# Patient Record
Sex: Female | Born: 1995 | Race: Black or African American | Hispanic: No | Marital: Single | Smoking: Never smoker
Health system: Southern US, Community
[De-identification: ages and names within clinical notes are randomized; demographics above are authoritative.]

## PROBLEM LIST (undated history)

## (undated) DIAGNOSIS — G43909 Migraine, unspecified, not intractable, without status migrainosus: Secondary | ICD-10-CM

---

## 2017-09-25 ENCOUNTER — Emergency Department (HOSPITAL_COMMUNITY)
Admission: EM | Admit: 2017-09-25 | Discharge: 2017-09-25 | Disposition: A | Discharge status: Home / Self Care | Payer: BLUE CROSS/BLUE SHIELD | Attending: Emergency Medicine | Admitting: Emergency Medicine

## 2017-09-25 ENCOUNTER — Encounter (HOSPITAL_COMMUNITY): Payer: Self-pay | Admitting: *Deleted

## 2017-09-25 ENCOUNTER — Emergency Department (HOSPITAL_COMMUNITY): Payer: BLUE CROSS/BLUE SHIELD

## 2017-09-25 ENCOUNTER — Other Ambulatory Visit: Payer: Self-pay

## 2017-09-25 DIAGNOSIS — F1210 Cannabis abuse, uncomplicated: Secondary | ICD-10-CM | POA: Diagnosis not present

## 2017-09-25 DIAGNOSIS — R51 Headache: Secondary | ICD-10-CM | POA: Diagnosis present

## 2017-09-25 DIAGNOSIS — R519 Headache, unspecified: Secondary | ICD-10-CM

## 2017-09-25 HISTORY — DX: Migraine, unspecified, not intractable, without status migrainosus: G43.909

## 2017-09-25 MED ORDER — SODIUM CHLORIDE 0.9 % IV BOLUS (SEPSIS)
1000.0000 mL | Freq: Once | INTRAVENOUS | Status: AC
  Administered 2017-09-25: 1000 mL via INTRAVENOUS

## 2017-09-25 MED ORDER — BUTALBITAL-APAP-CAFFEINE 50-325-40 MG PO TABS: 1.0000 | ORAL_TABLET | Freq: Four times a day (QID) | ORAL | 0 refills | Status: AC | PRN

## 2017-09-25 MED ORDER — DIPHENHYDRAMINE HCL 50 MG/ML IJ SOLN
25.0000 mg | Freq: Once | INTRAMUSCULAR | Status: AC
  Administered 2017-09-25: 25 mg via INTRAVENOUS
  Filled 2017-09-25: qty 1

## 2017-09-25 MED ORDER — KETOROLAC TROMETHAMINE 30 MG/ML IJ SOLN
30.0000 mg | Freq: Once | INTRAMUSCULAR | Status: AC
  Administered 2017-09-25: 30 mg via INTRAVENOUS
  Filled 2017-09-25: qty 1

## 2017-09-25 MED ORDER — DEXAMETHASONE SODIUM PHOSPHATE 10 MG/ML IJ SOLN
10.0000 mg | Freq: Once | INTRAMUSCULAR | Status: AC
  Administered 2017-09-25: 10 mg via INTRAVENOUS
  Filled 2017-09-25: qty 1

## 2017-09-25 MED ORDER — METOCLOPRAMIDE HCL 5 MG/ML IJ SOLN
10.0000 mg | Freq: Once | INTRAMUSCULAR | Status: AC
  Administered 2017-09-25: 10 mg via INTRAVENOUS
  Filled 2017-09-25: qty 2

## 2017-09-25 NOTE — ED Notes (Signed)
ED Provider at bedside. 

## 2017-09-25 NOTE — ED Notes (Signed)
Patient transported to CT 

## 2017-09-25 NOTE — ED Provider Notes (Signed)
[facility]   CSN: [id number] Arrival date & time: 09/25/17  0848     History   Chief Complaint Chief Complaint  Patient presents with  . Headache    HPI Shari Johnson is a 21 y.o. female.  HPI   21 year old female with history of migraine presenting for evaluation of headache.  Patient developed gradual onset of progressive worsening headache that started earlier this morning.  Headache is described as a sharp and soreness sensation to the right side of her face radiates down to right neck with tingling sensation to her left arm.  She rates the pain as 10 out of 10.  She endorsed light and sound sensitivity, feeling nauseous, and has vomited several times of nonbloody nonbilious content.  Headache feels similar to prior migraine but more severe.  No specific treatment tried.  She denies being pregnant due to same sex practice.  No report of fever, chills, URI symptoms, or rash.  Past Medical History:  Diagnosis Date  . Migraine     There are no active problems to display for this patient.   History reviewed. No pertinent surgical history.  OB History    No data available       Home Medications    Prior to Admission medications   Not on File    Family History History reviewed. No pertinent family history.  Social History Social History   Tobacco Use  . Smoking status: Never Smoker  Substance Use Topics  . Alcohol use: No    Frequency: Never  . Drug use: Yes    Types: Marijuana     Allergies   Amoxicillin   Review of Systems Review of Systems  All other systems reviewed and are negative.    Physical Exam Updated Vital Signs BP 115/75 (BP Location: Right Arm)   Pulse 81   Temp 98.2 F (36.8 C) (Oral)   Resp 16   Ht 5\' 4"  (1.626 m)   Wt 70.3 kg (155 lb)   LMP 08/27/2017   SpO2 99%   BMI 26.61 kg/m   Physical Exam  Constitutional: She is oriented to person, place, and  time. She appears well-developed and well-nourished. No distress.  Appears uncomfortable but nontoxic  HENT:  Head: Atraumatic.  Eyes: Conjunctivae and EOM are normal. Pupils are equal, round, and reactive to light.  Neck: Normal range of motion. Neck supple.  Mild tenderness to right paracervical spinal muscle but no nuchal rigidity or cervical midline spine tenderness  Cardiovascular: Normal rate and regular rhythm.  Pulmonary/Chest: Effort normal and breath sounds normal.  Neurological: She is alert and oriented to person, place, and time. GCS eye subscore is 4. GCS verbal subscore is 5. GCS motor subscore is 6.  Neurologic exam:  Speech clear, pupils equal round reactive to light, extraocular movements intact  Normal peripheral visual fields Cranial nerves III through XII normal including no facial droop Follows commands, moves all extremities x4, normal strength to bilateral upper and lower extremities at all major muscle groups including grip Sensation normal to light touch  Coordination intact, no limb ataxia, finger-nose-finger normal Rapid alternating movements normal No pronator drift Gait normal   Skin: Skin is warm. No rash noted.  Psychiatric: She has a normal mood and affect.  Nursing note and vitals reviewed.    ED Treatments / Results  Labs (all labs ordered are listed, but only abnormal results are displayed) Labs Reviewed - No data to display  EKG  EKG Interpretation None       Radiology Ct Head Wo Contrast  Result Date: 09/25/2017 CLINICAL DATA:  Headache EXAM: CT HEAD WITHOUT CONTRAST TECHNIQUE: Contiguous axial images were obtained from the base of the skull through the vertex without intravenous contrast. COMPARISON:  None. FINDINGS: Brain: No evidence of acute infarction, hemorrhage, hydrocephalus, extra-axial collection or mass lesion/mass effect. Vascular: No hyperdense vessel or unexpected calcification. Skull: Normal. Negative for fracture or  focal lesion. Sinuses/Orbits: The visualized paranasal sinuses are essentially clear. The mastoid air cells are unopacified. Other: None. IMPRESSION: Normal head CT. Electronically Signed   By: Charline BillsSriyesh  Krishnan M.D.   On: 09/25/2017 11:24    Procedures Procedures (including critical care time)  Medications Ordered in ED Medications  ketorolac (TORADOL) 30 MG/ML injection 30 mg (30 mg Intravenous Given 09/25/17 1021)  diphenhydrAMINE (BENADRYL) injection 25 mg (25 mg Intravenous Given 09/25/17 1023)  metoCLOPramide (REGLAN) injection 10 mg (10 mg Intravenous Given 09/25/17 1022)  dexamethasone (DECADRON) injection 10 mg (10 mg Intravenous Given 09/25/17 1020)  sodium chloride 0.9 % bolus 1,000 mL (0 mLs Intravenous Stopped 09/25/17 1145)     Initial Impression / Assessment and Plan / ED Course  I have reviewed the triage vital signs and the nursing notes.  Pertinent labs & imaging results that were available during my care of the patient were reviewed by me and considered in my medical decision making (see chart for details).     BP (!) 132/94   Pulse 79   Temp 98.2 F (36.8 C) (Oral)   Resp 12   Ht 5\' 4"  (1.626 m)   Wt 70.3 kg (155 lb)   LMP 08/27/2017   SpO2 100%   BMI 26.61 kg/m    Final Clinical Impressions(s) / ED Diagnoses   Final diagnoses:  Bad headache    ED Discharge Orders        Ordered    butalbital-acetaminophen-caffeine (FIORICET, ESGIC) 50-325-40 MG tablet  Every 6 hours PRN     09/25/17 1240     Headache similar to previous, no fever, neck stiffness, neuro findings or new symptoms to suggest more serious etiology.  I don't think SAH, ICH, meningitis, encephalitis, mass at this time.  No recent trauma.  I don't feel imaging necessary at this time.  Plan to control symptoms.  12:38 PM Headache improves with migraine cocktail.  Pt felt much better. Head CT scan unremarkable.  Suspect complicated migraine causing L arm tingling.  Strength is normal.   Doubt stroke or meningitis.  Pt is stable for discharge. Return precaution given.     Fayrene Helperran, Pecolia Marando, PA-C 09/25/17 1243    Mancel BaleWentz, Elliott, MD 09/25/17 [phone]

## 2017-09-25 NOTE — ED Triage Notes (Signed)
Pt reports waking up this am with severe right side headache with left arm numbness. Has n/v and sensitivity to light. Has hx of migraines.

## 2018-08-02 ENCOUNTER — Ambulatory Visit: Payer: BLUE CROSS/BLUE SHIELD | Admitting: Neurology

## 2018-08-02 ENCOUNTER — Encounter: Payer: Self-pay | Admitting: Neurology

## 2018-08-02 VITALS — BP 122/76 | HR 64 | Ht 64.0 in | Wt 147.0 lb

## 2018-08-02 DIAGNOSIS — G43109 Migraine with aura, not intractable, without status migrainosus: Secondary | ICD-10-CM

## 2018-08-02 MED ORDER — RIZATRIPTAN BENZOATE 5 MG PO TBDP: 5.0000 mg | ORAL_TABLET | ORAL | 11 refills | Status: DC | PRN

## 2018-08-02 NOTE — Progress Notes (Signed)
PATIENT: Shari Johnson DOB: 02-Jan-1996  Chief Complaint  Patient presents with  . Migraine    Reports having migraines sporadically.  She sometimes goes months without one. She has the following symptoms with her migraines:  numbness in mouth, left arm and left hand, blurred vision, intermittent nausea.  She was recently prescribed Fioricet which works well to resolve her pain.   Marland Kitchen PCP    Gennette Pac, PA     HISTORICAL  Shari Johnson is a 22 year old female, accompanied by her parents, seen in request by her primary care PA Gennette Pac for evaluation of chronic migraine with aura, initial evaluation was on August 02, 2018. I have reviewed and summarized the referring note from the referring physician.  She experienced her first migraine headache at 11th grade, after she fell down at the basketball court, no loss of consciousness, she was able to continue play, then she developed blurry vision, massive headaches, with associated light noise sensitivity, nauseous, she was treated at local emergency room, symptoms improved with medications.  She only has occasional headache, she had another major lateralized severe pounding headache a year later, since then, she has intermittent headaches about once or twice each year, recently she reported migraine preceded by visual change in her left visual field, then numbness of her left face, both corner, left upper extremity, but sparing left lower extremity, no weakness, lasting for about 10 to 15 minutes, occasionally only has aura without headaches, but oftentimes followed by right retro-orbital area headaches, also extending to right neck, occipital region, with light noise sensitivity, nauseous, lasting for few hours,  She was given prescription of Fioricet, works very well, took away her headache in few minutes, but with her nausea, sometimes she struggled to keep her medicine down.  In between the migraine headaches, she  denies persistent lateralized motor or sensory deficit, I personally reviewed CT head without contrast in November 2018, blood was normal.  Her mother has migraine headaches, she was not sure about the trigger of her migraine headaches.  REVIEW OF SYSTEMS: Full 14 system review of systems performed and notable only for fatigue, blurred vision All other review of systems were negative.  ALLERGIES: Allergies  Allergen Reactions  . Amoxicillin Rash    Has patient had a PCN reaction causing immediate rash, facial/tongue/throat swelling, SOB or lightheadedness with hypotension: Yes Has patient had a PCN reaction causing severe rash involving mucus membranes or skin necrosis:Yes Has patient had a PCN reaction that required hospitalization: No Has patient had a PCN reaction occurring within the last 10 years: No If all of the above answers are "NO", then may proceed with Cephalosporin use.     HOME MEDICATIONS: Current Outpatient Medications  Medication Sig Dispense Refill  . butalbital-acetaminophen-caffeine (FIORICET, ESGIC) 50-325-40 MG tablet Take 1-2 tablets by mouth every 6 (six) hours as needed for headache. 20 tablet 0  . naproxen sodium (ALEVE) 220 MG tablet Take 220 mg by mouth 2 (two) times daily as needed (menstrual cramps).     No current facility-administered medications for this visit.     PAST MEDICAL HISTORY: Past Medical History:  Diagnosis Date  . Migraine     PAST SURGICAL HISTORY: History reviewed. No pertinent surgical history.  FAMILY HISTORY: Family History  Problem Relation Age of Onset  . Hypertension Mother   . Healthy Father   . Migraines Maternal Grandmother   . Hypertension Maternal Grandmother   . Heart attack Maternal Grandmother  SOCIAL HISTORY: Social History   Socioeconomic History  . Marital status: Single    Spouse name: Not on file  . Number of children: 0  . Years of education: college student  . Highest education level: Not on  file  Occupational History  . Occupation: pick off line  Social Needs  . Financial resource strain: Not on file  . Food insecurity:    Worry: Not on file    Inability: Not on file  . Transportation needs:    Medical: Not on file    Non-medical: Not on file  Tobacco Use  . Smoking status: Never Smoker  . Smokeless tobacco: Current User  Substance and Sexual Activity  . Alcohol use: No    Frequency: Never  . Drug use: Yes    Types: Marijuana    Comment: occasional use  . Sexual activity: Not on file  Lifestyle  . Physical activity:    Days per week: Not on file    Minutes per session: Not on file  . Stress: Not on file  Relationships  . Social connections:    Talks on phone: Not on file    Gets together: Not on file    Attends religious service: Not on file    Active member of club or organization: Not on file    Attends meetings of clubs or organizations: Not on file    Relationship status: Not on file  . Intimate partner violence:    Fear of current or ex partner: Not on file    Emotionally abused: Not on file    Physically abused: Not on file    Forced sexual activity: Not on file  Other Topics Concern  . Not on file  Social History Narrative   Lives at home with roommates.   Right-handed.   Occasional caffeine use.     PHYSICAL EXAM   Vitals:   08/02/18 0742  BP: 122/76  Pulse: 64  Weight: 147 lb (66.7 kg)  Height: 5\' 4"  (1.626 m)    Not recorded      Body mass index is 25.23 kg/m.  PHYSICAL EXAMNIATION:  Gen: NAD, conversant, well nourised, obese, well groomed                     Cardiovascular: Regular rate rhythm, no peripheral edema, warm, nontender. Eyes: Conjunctivae clear without exudates or hemorrhage Neck: Supple, no carotid bruits. Pulmonary: Clear to auscultation bilaterally   NEUROLOGICAL EXAM:  MENTAL STATUS: Speech:    Speech is normal; fluent and spontaneous with normal comprehension.  Cognition:     Orientation to time,  place and person     Normal recent and remote memory     Normal Attention span and concentration     Normal Language, naming, repeating,spontaneous speech     Fund of knowledge   CRANIAL NERVES: CN II: Visual fields are full to confrontation. Fundoscopic exam is normal with sharp discs and no vascular changes. Pupils are round equal and briskly reactive to light. CN III, IV, VI: extraocular movement are normal. No ptosis. CN V: Facial sensation is intact to pinprick in all 3 divisions bilaterally. Corneal responses are intact.  CN VII: Face is symmetric with normal eye closure and smile. CN VIII: Hearing is normal to rubbing fingers CN IX, X: Palate elevates symmetrically. Phonation is normal. CN XI: Head turning and shoulder shrug are intact CN XII: Tongue is midline with normal movements and no atrophy.  MOTOR: There is  no pronator drift of out-stretched arms. Muscle bulk and tone are normal. Muscle strength is normal.  REFLEXES: Reflexes are 2+ and symmetric at the biceps, triceps, knees, and ankles. Plantar responses are flexor.  SENSORY: Intact to light touch, pinprick, positional sensation and vibratory sensation are intact in fingers and toes.  COORDINATION: Rapid alternating movements and fine finger movements are intact. There is no dysmetria on finger-to-nose and heel-knee-shin.    GAIT/STANCE: Posture is normal. Gait is steady with normal steps, base, arm swing, and turning. Heel and toe walking are normal. Tandem gait is normal.  Romberg is absent.   DIAGNOSTIC DATA (LABS, IMAGING, TESTING) - I reviewed patient records, labs, notes, testing and imaging myself where available.   ASSESSMENT AND PLAN  Genevive Stasha Naraine is a 22 y.o. female   Chronic migraine with aura  CT head was normal in Nov 2018.  Maxalt as needed.  Aleve as needed  Observe, and avoid triggers,  Levert Feinstein, M.D. Ph.D.  [facility] [address] [place], [place] [address], Ph: [phone] Fax: [phone]  IO:[id number], [place], [place]

## 2018-08-02 NOTE — Patient Instructions (Signed)
You may try aleve as needed,   May mix together with Maxalt,

## 2019-05-16 IMAGING — CT CT HEAD W/O CM
4 series · 17 of 47 positions shown, 19 images · non-contrast
Comparison: None.

CLINICAL DATA: Headache

EXAM:
CT HEAD WITHOUT CONTRAST
TECHNIQUE: Contiguous axial images were obtained from the base of the skull
through the vertex without intravenous contrast.

[Series 3: head without · axial · non-contrast · 0.43mm/px · z∈[-129,-24]mm · 7 of 29 slices shown, 9 images]
[im 4/29  brain]
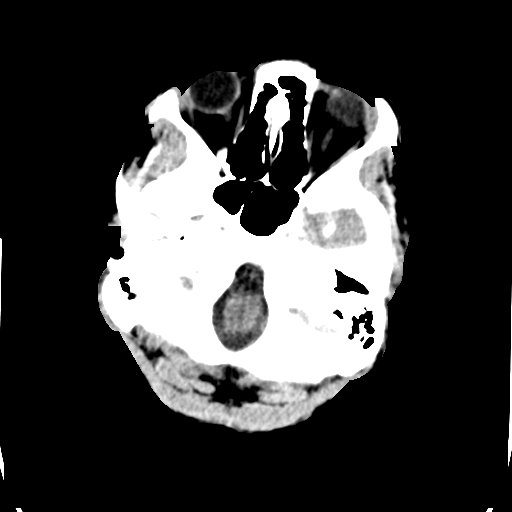
[im 4/29  bone]
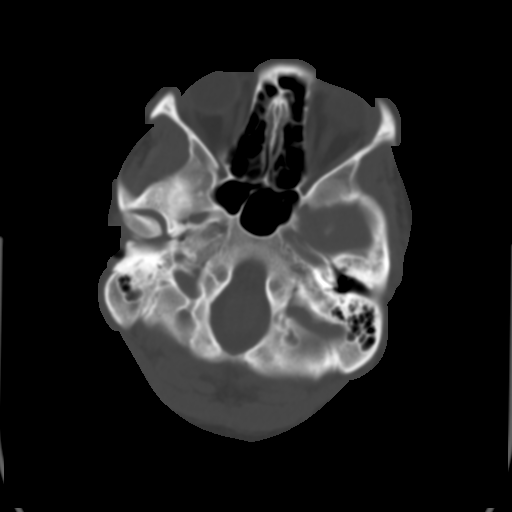
[im 8/29  brain]
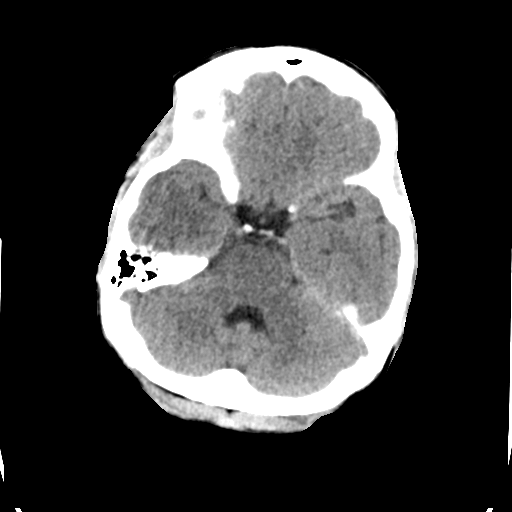
[im 11/29  brain]
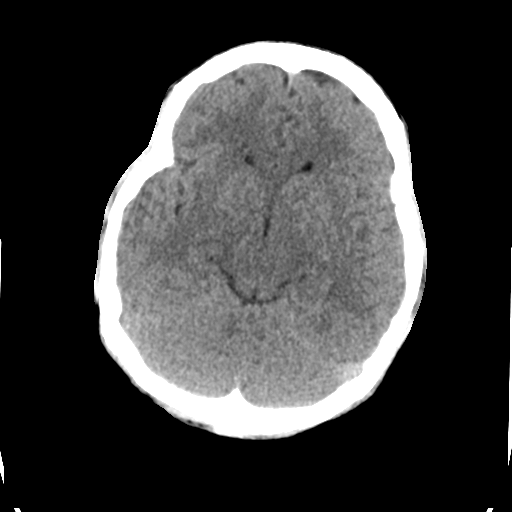
[im 15/29  brain]
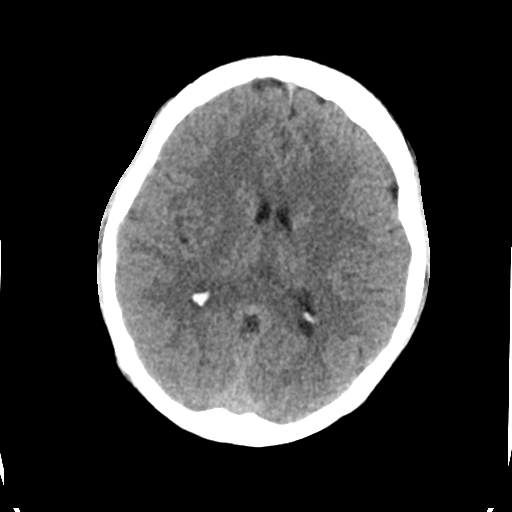
[im 18/29  brain]
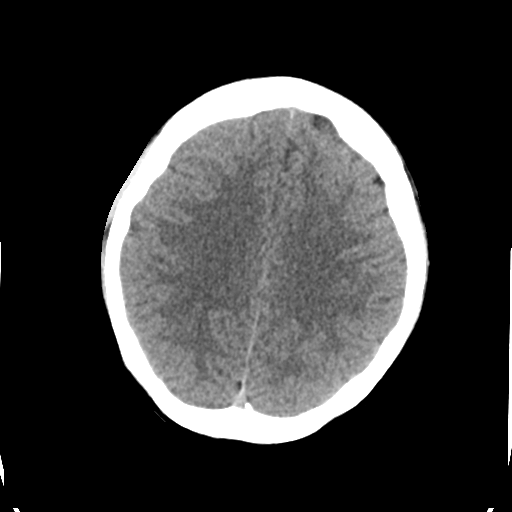
[im 18/29  bone]
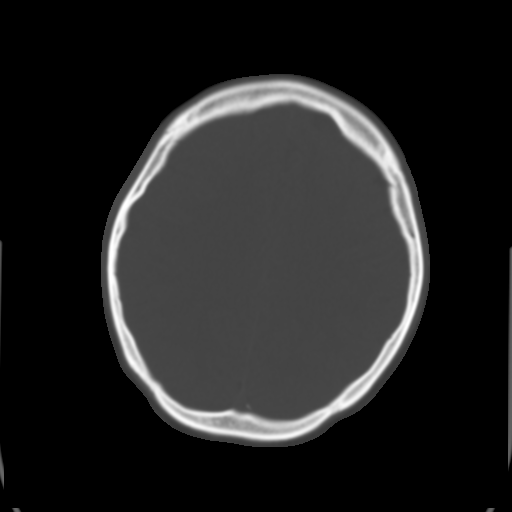
[im 22/29  brain]
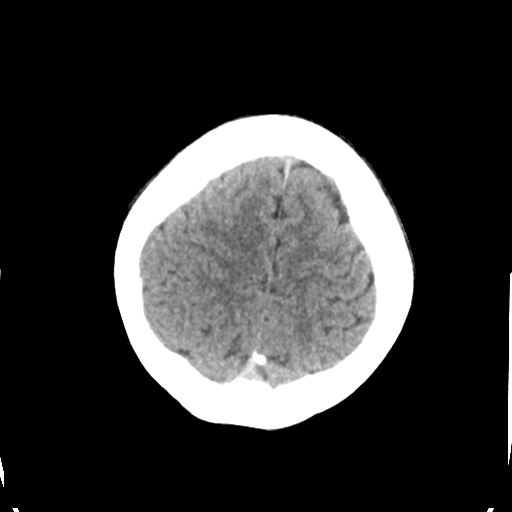
[im 25/29  brain]
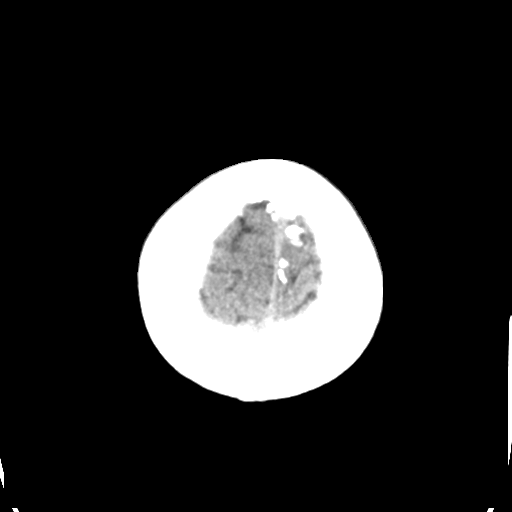

[Series 4: head bone · axial · 0.43mm/px · z∈[-130,-82]mm · 4 of 71 slices shown]
[im 8/71  bone]
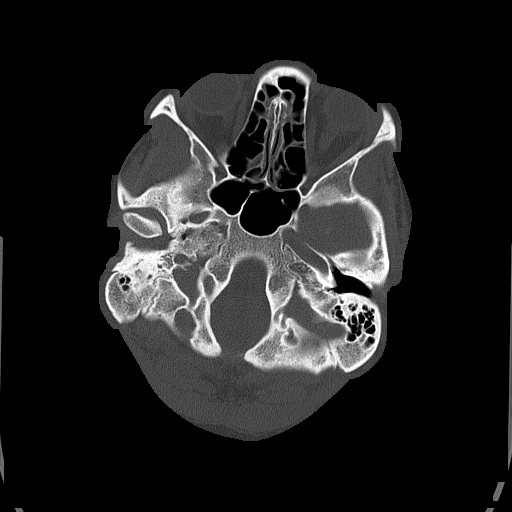
[im 15/71  bone]
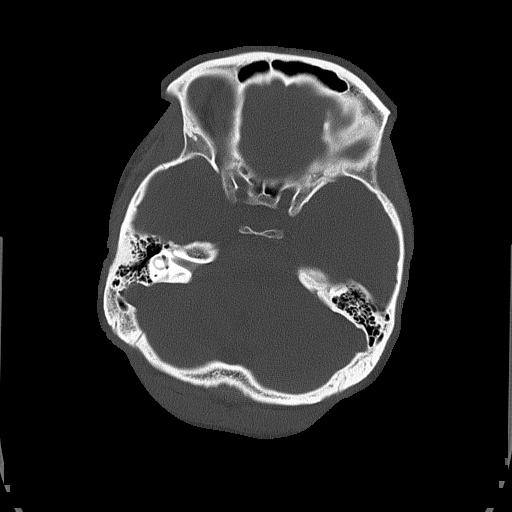
[im 22/71  bone]
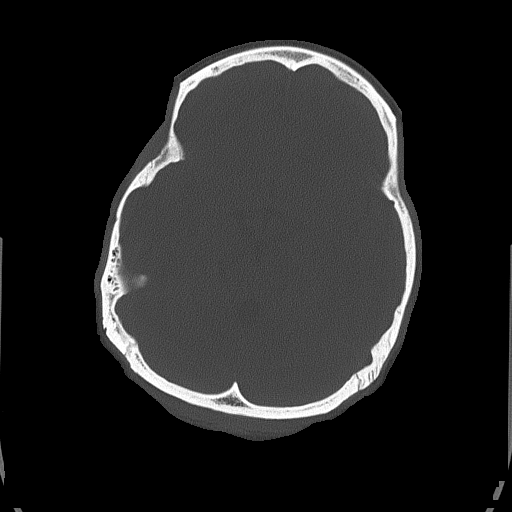
[im 32/71  bone]
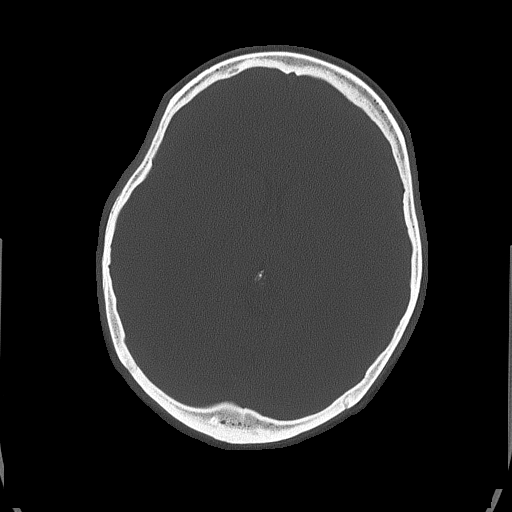

[Series 5: head without cor · coronal · non-contrast · 0.35mm/px · 3 of 67 slices shown]
[im 23/67  brain]
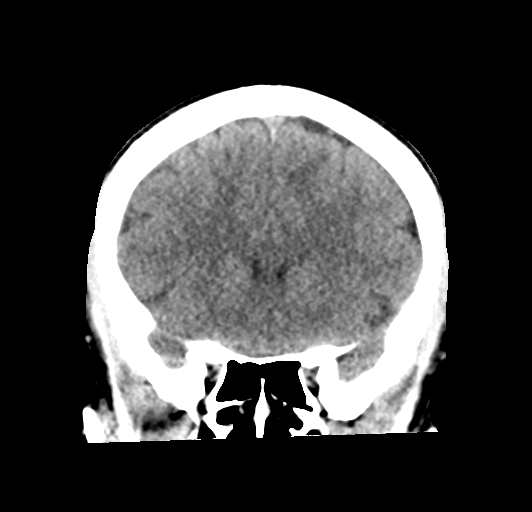
[im 30/67  brain]
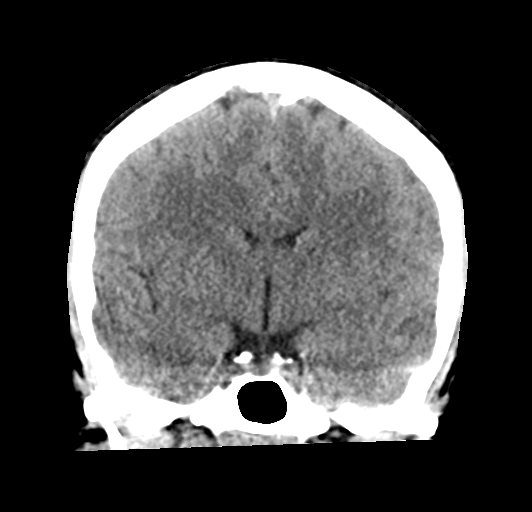
[im 37/67  brain]
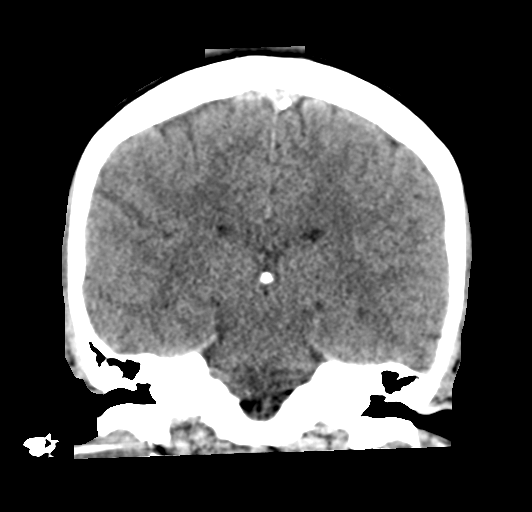

[Series 6: head without sag · sagittal · non-contrast · 0.32mm/px · 3 of 67 slices shown]
[im 24/67  brain]
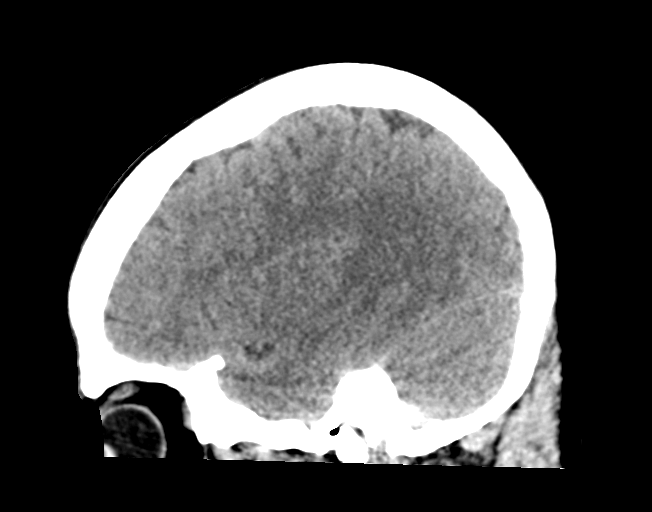
[im 34/67  brain]
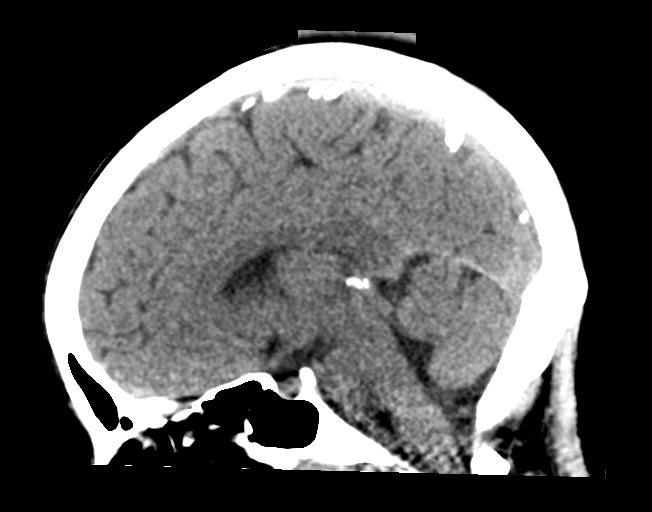
[im 44/67  brain]
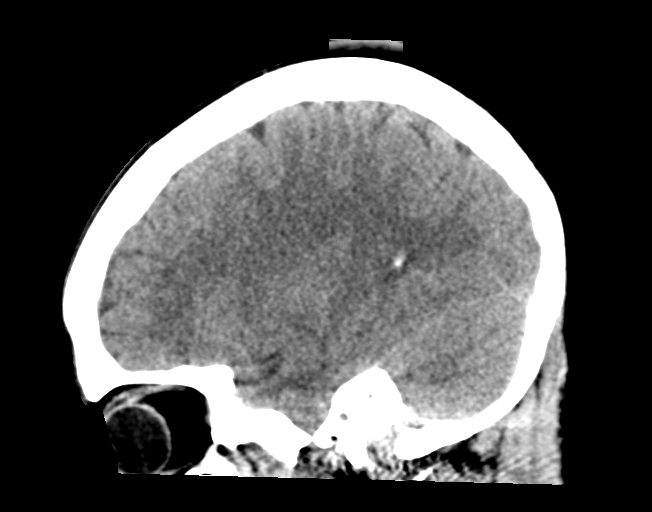

[17 of 47 positions shown; findings below may reference images not displayed]

FINDINGS: Brain: No evidence of acute infarction, hemorrhage, hydrocephalus,
extra-axial collection or mass lesion/mass effect.

Vascular: No hyperdense vessel or unexpected calcification.

Skull: Normal. Negative for fracture or focal lesion.

Sinuses/Orbits: The visualized paranasal sinuses are essentially
clear. The mastoid air cells are unopacified.

Other: None.
IMPRESSION: Normal head CT.

## 2019-05-30 ENCOUNTER — Other Ambulatory Visit: Payer: Self-pay

## 2019-05-30 ENCOUNTER — Encounter (HOSPITAL_COMMUNITY): Payer: Self-pay | Admitting: Emergency Medicine

## 2019-05-30 ENCOUNTER — Emergency Department (HOSPITAL_COMMUNITY): Payer: BLUE CROSS/BLUE SHIELD

## 2019-05-30 ENCOUNTER — Emergency Department (HOSPITAL_COMMUNITY)
Admission: EM | Admit: 2019-05-30 | Discharge: 2019-05-30 | Disposition: A | Discharge status: Home / Self Care | Payer: BLUE CROSS/BLUE SHIELD | Attending: Emergency Medicine | Admitting: Emergency Medicine

## 2019-05-30 DIAGNOSIS — R202 Paresthesia of skin: Secondary | ICD-10-CM | POA: Diagnosis not present

## 2019-05-30 DIAGNOSIS — R200 Anesthesia of skin: Secondary | ICD-10-CM | POA: Diagnosis present

## 2019-05-30 LAB — COMPREHENSIVE METABOLIC PANEL
ALT: 15 U/L (ref 0–44)
AST: 23 U/L (ref 15–41)
Albumin: 3.9 g/dL (ref 3.5–5.0)
Alkaline Phosphatase: 40 U/L (ref 38–126)
Anion gap: 8 (ref 5–15)
BUN: 6 mg/dL (ref 6–20)
CO2: 24 mmol/L (ref 22–32)
Calcium: 9.1 mg/dL (ref 8.9–10.3)
Chloride: 105 mmol/L (ref 98–111)
Creatinine, Ser: 0.91 mg/dL (ref 0.44–1.00)
GFR calc Af Amer: >60 mL/min (ref >60)
GFR calc non Af Amer: >60 mL/min (ref >60)
Glucose, Bld: 90 mg/dL (ref 70–99)
Potassium: 4.3 mmol/L (ref 3.5–5.1)
Sodium: 137 mmol/L (ref 135–145)
Total Bilirubin: 0.7 mg/dL (ref 0.3–1.2)
Total Protein: 6.7 g/dL (ref 6.5–8.1)

## 2019-05-30 LAB — CBC WITH DIFFERENTIAL/PLATELET
Abs Immature Granulocytes: 0.01 10*3/uL (ref 0.00–0.07)
Basophils Absolute: 0.1 10*3/uL (ref 0.0–0.1)
Basophils Relative: 1 %
Eosinophils Absolute: 0.3 10*3/uL (ref 0.0–0.5)
Eosinophils Relative: 5 %
HCT: 40.7 % (ref 36.0–46.0)
Hemoglobin: 12.6 g/dL (ref 12.0–15.0)
Immature Granulocytes: 0 %
Lymphocytes Relative: 32 %
Lymphs Abs: 1.6 10*3/uL (ref 0.7–4.0)
MCH: 27.5 pg (ref 26.0–34.0)
MCHC: 31 g/dL (ref 30.0–36.0)
MCV: 88.9 fL (ref 80.0–100.0)
Monocytes Absolute: 0.4 10*3/uL (ref 0.1–1.0)
Monocytes Relative: 8 %
Neutro Abs: 2.7 10*3/uL (ref 1.7–7.7)
Neutrophils Relative %: 54 %
Platelets: 193 10*3/uL (ref 150–400)
RBC: 4.58 MIL/uL (ref 3.87–5.11)
RDW: 12.5 % (ref 11.5–15.5)
WBC: 5 10*3/uL (ref 4.0–10.5)
nRBC: 0 % (ref 0.0–0.2)

## 2019-05-30 LAB — I-STAT BETA HCG BLOOD, ED (MC, WL, AP ONLY): I-stat hCG, quantitative: <5 m[IU]/mL (ref <5)

## 2019-05-30 MED ORDER — METOCLOPRAMIDE HCL 5 MG/ML IJ SOLN
10.0000 mg | Freq: Once | INTRAMUSCULAR | Status: AC
  Administered 2019-05-30: 10 mg via INTRAVENOUS
  Filled 2019-05-30: qty 2

## 2019-05-30 MED ORDER — DIPHENHYDRAMINE HCL 50 MG/ML IJ SOLN
25.0000 mg | Freq: Once | INTRAMUSCULAR | Status: AC
  Administered 2019-05-30: 25 mg via INTRAVENOUS
  Filled 2019-05-30: qty 1

## 2019-05-30 MED ORDER — RIZATRIPTAN BENZOATE 5 MG PO TBDP: 5.0000 mg | ORAL_TABLET | ORAL | 11 refills | Status: AC | PRN

## 2019-05-30 MED ORDER — SODIUM CHLORIDE 0.9 % IV BOLUS
1000.0000 mL | Freq: Once | INTRAVENOUS | Status: AC
  Administered 2019-05-30: 1000 mL via INTRAVENOUS

## 2019-05-30 MED ORDER — KETOROLAC TROMETHAMINE 30 MG/ML IJ SOLN
30.0000 mg | Freq: Once | INTRAMUSCULAR | Status: AC
  Administered 2019-05-30: 30 mg via INTRAVENOUS
  Filled 2019-05-30: qty 1

## 2019-05-30 NOTE — Discharge Instructions (Signed)
As we discussed, your work-up today was reassuring.  I suspect that this is due to your migraine.  Please follow-up with neurology.  Return to the emergency department for any weakness of your arms or legs, vision changes, difficulty walking or any other worsening or concerning symptoms.

## 2019-05-30 NOTE — ED Notes (Signed)
Patient transported to CT 

## 2019-05-30 NOTE — ED Triage Notes (Signed)
Onset today at 0900 developed left lower extremity numbness while driving then radiating left upper extremity and face. States history of migraines and these symptoms usually happen on the right side.  Denies a headache. Alert answering and following commands appropriate. Moves all extremities bilateral equal and strong.

## 2019-05-30 NOTE — ED Provider Notes (Signed)
Shari Johnson Provider Note   CSN: [id number] Arrival date & time: 05/30/19  1114    History   Chief Complaint Chief Complaint  Patient presents with  . Numbness    HPI Shari Johnson is a 23 y.o. female past medical history of migraines who presents today for evaluation of numbness to her left upper and lower extremities as well as face.  She reports that symptoms began at about 9 AM this morning while she was driving.  She states that the numbness occurred on both extremities at the same time and then spread up towards her face.  She states that she denied any difficulty moving her arms or legs, difficulty walking or any weakness.  She states she has a history of migraines that often present with extremity numbness but she states most of the time it is on the right side.  She states her typical migraine courses develop numbness in her right upper and right lower extremity and several hours later, she will developed a headache.  She had a recent migraine about 2 days ago but did not have any of her medications so she did not take anything.  She has not seen neuro for about a year.  She denies any preceding trauma, injury, fall.  She has not had difficulty speaking or noticed any slurred speech.  She denies any fevers, blurry vision, chest pain, difficulty breathing, abdominal pain, nausea/vomiting.     The history is provided by the patient.    Past Medical History:  Diagnosis Date  . Migraine     Patient Active Problem List   Diagnosis Date Noted  . Chronic migraine with aura 08/02/2018    History reviewed. No pertinent surgical history.   OB History   No obstetric history on file.      Home Medications    Prior to Admission medications   Medication Sig Start Date End Date Taking? Authorizing Provider  naproxen sodium (ALEVE) 220 MG tablet Take 220 mg by mouth 2 (two) times daily as needed (menstrual cramps).    [provider]  rizatriptan (MAXALT-MLT) 5 MG disintegrating tablet Take 1 tablet (5 mg total) by mouth as needed. May repeat in 2 hours if needed 05/30/19   Volanda Napoleon, PA-C    Family History Family History  Problem Relation Age of Onset  . Hypertension Mother   . Healthy Father   . Migraines Maternal Grandmother   . Hypertension Maternal Grandmother   . Heart attack Maternal Grandmother     Social History Social History   Tobacco Use  . Smoking status: Never Smoker  . Smokeless tobacco: Current User  Substance Use Topics  . Alcohol use: No    Frequency: Never  . Drug use: Yes    Types: Marijuana    Comment: occasional use     Allergies   Amoxicillin   Review of Systems Review of Systems  Constitutional: Negative for fever.  Eyes: Negative for visual disturbance.  Respiratory: Negative for cough and shortness of breath.   Cardiovascular: Negative for chest pain.  Gastrointestinal: Negative for abdominal pain, nausea and vomiting.  Genitourinary: Negative for dysuria and hematuria.  Neurological: Positive for numbness. Negative for weakness and headaches.  All other systems reviewed and are negative.    Physical Exam Updated Vital Signs BP 123/73   Pulse 91   Temp 98.4 F (36.9 C) (Oral)   Resp 17   Ht 5\' 4"  (1.626 m)  Wt 65 kg   SpO2 100%   BMI 24.60 kg/m   Physical Exam Vitals signs and nursing note reviewed.  Constitutional:      Appearance: Normal appearance. She is well-developed.  HENT:     Head: Normocephalic and atraumatic.  Eyes:     General: Lids are normal.     Conjunctiva/sclera: Conjunctivae normal.     Pupils: Pupils are equal, round, and reactive to light.     Comments: PERRL. EOMs intact. No nystagmus. No neglect.   Neck:     Musculoskeletal: Full passive range of motion without pain and neck supple.     Comments: Neck is supple and without rigidity. Cardiovascular:     Rate and Rhythm: Normal rate and regular rhythm.      Pulses: Normal pulses.          Radial pulses are 2+ on the right side and 2+ on the left side.       Dorsalis pedis pulses are 2+ on the right side and 2+ on the left side.     Heart sounds: Normal heart sounds. No murmur. No friction rub. No gallop.   Pulmonary:     Effort: Pulmonary effort is normal.     Breath sounds: Normal breath sounds.     Comments: Lungs clear to auscultation bilaterally.  Symmetric chest rise.  No wheezing, rales, rhonchi. Abdominal:     Palpations: Abdomen is soft. Abdomen is not rigid.     Tenderness: There is no abdominal tenderness. There is no guarding.     Comments: Abdomen is soft, non-distended, non-tender. No rigidity, No guarding. No peritoneal signs.  Musculoskeletal: Normal range of motion.  Skin:    General: Skin is warm and dry.     Capillary Refill: Capillary refill takes less than 2 seconds.  Neurological:     Mental Status: She is alert and oriented to person, place, and time.     Comments: Cranial nerves III-XII intact Follows commands, Moves all extremities  5/5 strength to BUE and BLE  Equal grip strength bilaterally.  Sensation intact throughout all major nerve distributions of the right face, right side.  She reports diminished sensation noted to left face as well as left upper extremity that begins at the shoulder just at the deltoid level and extends distally.  This sensory deficit is only evident on the anterior aspect of her left upper extremity and is not circumferential.  Additional subjective sensory deficit noted to the anterior aspect of her left lower extremity that begins at about the proximal thigh and extends distally, again only present on anterior aspect. Normal coordination No gait abnormalities  No slurred speech. No facial droop.   Psychiatric:        Speech: Speech normal.      ED Treatments / Results  Labs (all labs ordered are listed, but only abnormal results are displayed) Labs Reviewed  COMPREHENSIVE  METABOLIC PANEL  CBC WITH DIFFERENTIAL/PLATELET  I-STAT BETA HCG BLOOD, ED ([facility], [facility], [facility] ONLY)    EKG None  Radiology Ct Head Wo Contrast  Result Date: 05/30/2019 CLINICAL DATA:  Left-sided numbness EXAM: CT HEAD WITHOUT CONTRAST TECHNIQUE: Contiguous axial images were obtained from the base of the skull through the vertex without intravenous contrast. COMPARISON:  September 25, 2017 FINDINGS: Brain: The ventricles are normal in size and configuration. There is no evident intracranial mass, hemorrhage, extra-axial fluid collection, or midline shift. Brain parenchyma appears unremarkable. No acute infarct evident. Vascular: No hyperdense vessel. No  appreciable vascular calcification evident. Skull: The bony calvarium appears intact. Sinuses/Orbits: There are air-fluid levels in each maxillary antrum. There is extensive opacification in multiple ethmoid air cells. There is slight mucosal thickening in each sphenoid sinus. Orbits appear symmetric bilaterally. Other: Mastoid air cells are clear. IMPRESSION: Multifocal paranasal sinus disease.  Study otherwise unremarkable. w Electronically Signed   By: Bretta BangWilliam  Woodruff III M.D.   On: 05/30/2019 14:06    Procedures Procedures (including critical care time)  Medications Ordered in ED Medications  sodium chloride 0.9 % bolus 1,000 mL (0 mLs Intravenous Stopped 05/30/19 1428)  metoCLOPramide (REGLAN) injection 10 mg (10 mg Intravenous Given 05/30/19 1214)  diphenhydrAMINE (BENADRYL) injection 25 mg (25 mg Intravenous Given 05/30/19 1214)  ketorolac (TORADOL) 30 MG/ML injection 30 mg (30 mg Intravenous Given 05/30/19 1316)     Initial Impression / Assessment and Plan / ED Course  I have reviewed the triage vital signs and the nursing notes.  Pertinent labs & imaging results that were available during my care of the patient were reviewed by me and considered in my medical decision making (see chart for details).        23 year old female with past  medical stable migraines who presents for evaluation of left upper and left lower extremity numbness that began about 9 AM this morning.  History of migraines that often involved extremity numbness but states that it typically involves the right side.  History of migraine 2 days ago but did not have any medications to take.  No headache at this time which she states is typical for her migraine progression. Patient is afebrile, non-toxic appearing, sitting comfortably on examination table. Vital signs reviewed and stable.  Normal strength.  No evidence of weakness.  Reported sensory deficit to left upper and left lower extremity.  This deficit is not circumferential and only involves the anterior aspect of her left upper extremity (C5, C6, T1) and her left lower extremity (L2, L3, L4, L5, S4).  Patient states she came to the emergency Johnson today because she was concerned about symptoms present in her left upper extremity.  She states that normally with her migraines, she has in her right upper extremity though I see a neurology note in 2019 that mentioned numbness to left side including upper and lower extremity.  Suspect that this may be partial complex migraine.  Doubt infectious process such as meningitis.  Doubt intracranial abnormality.  Given the atypical presentation of the symptoms and the fact that her numbness is not circumferential, do not suspect CVA.  Plan to check labs, imaging.  I-stat beta negative. CMP is normal. CBC without any significant leukocytosis or anemia.   Re-evaluation. Patient reports improvement in numbness/tingling sensation. She is resting comfortably on bed.   CT head showed multifocal paranasal sinus disease.  Otherwise normal CT scan.  Reevaluation.  Patient reports her symptoms have completely resolved.  I ambulated patient Johnson with no signs of gait ataxia.  I suspect that this is a partial complex migraine.  At this time, I do not suspect CVA.  We will plan to  use restart patient on her Maxalt.  Instructed patient to follow-up with neuro as previously seen. At this time, patient exhibits no emergent life-threatening condition that require further evaluation in ED or admission. Discussed patient with Dr. Lynelle DoctorKnapp who is agreeable to plan. Patient had ample opportunity for questions and discussion. All patient's questions were answered with full understanding. Strict return precautions discussed. Patient expresses understanding and agreement  to plan.   Portions of this note were generated with Scientist, clinical (histocompatibility and immunogenetics)Dragon dictation software. Dictation errors may occur despite best attempts at proofreading.  Final Clinical Impressions(s) / ED Diagnoses   Final diagnoses:  Paresthesia    ED Discharge Orders         Ordered    rizatriptan (MAXALT-MLT) 5 MG disintegrating tablet  As needed     05/30/19 1421           Rosana HoesLayden, Lindsey A, PA-C 05/30/19 1703    Linwood DibblesKnapp, Jon, MD 05/31/19 [phone]

## 2019-06-06 ENCOUNTER — Other Ambulatory Visit: Payer: Self-pay

## 2019-06-06 ENCOUNTER — Ambulatory Visit (INDEPENDENT_AMBULATORY_CARE_PROVIDER_SITE_OTHER): Payer: BC Managed Care – PPO | Admitting: Neurology

## 2019-06-06 ENCOUNTER — Encounter: Payer: Self-pay | Admitting: Neurology

## 2019-06-06 VITALS — BP 116/72 | HR 62 | Temp 97.7°F | Ht 64.0 in | Wt 136.0 lb

## 2019-06-06 DIAGNOSIS — G43109 Migraine with aura, not intractable, without status migrainosus: Secondary | ICD-10-CM

## 2019-06-06 DIAGNOSIS — R202 Paresthesia of skin: Secondary | ICD-10-CM | POA: Diagnosis not present

## 2019-06-06 DIAGNOSIS — R4789 Other speech disturbances: Secondary | ICD-10-CM | POA: Insufficient documentation

## 2019-06-06 NOTE — Patient Instructions (Signed)
Over-counter medications as migraine prevention:  Magnesium oxide 400 mg twice a day Riboflavin= vitamin B2 100 mg twice a day  Migraine abortive treatment: Aleve 1 to 2 tablets as needed or Excedrin Migraine as needed Or Rizatriptan as needed

## 2019-06-06 NOTE — Progress Notes (Signed)
PATIENT: Shari Johnson DOB: [birth year]  Chief Complaint  Patient presents with  . Migraine    She is here to follow up from ED visit on 05/30/2019.  Reports episode of numbness in left face, arm and leg lasting hours.  She went to the hospital for further evaluation. It was felt she was having a complex migraine.  She was prescribed Maxalt to restart.  She has not experienced any further similar symptoms.     HISTORICAL  Shari Johnson is a 23 year old female, seen in request by emergency room for evaluation of migraine headache, side paresthesia, initial evaluation was on June 06, 2019.  I have reviewed and summarized the referring note from the referring physician.  She reported history of migraine headaches since high school, after she fell, bumped her head on the basketball floor, oftentimes her migraines are preceded by visual disturbance, times her migraines are preceded by word finding difficulties, transient confusion lasting up to 1 hours, followed by more severe headache  Her typical migraine are lateralized severe pounding headache with associated light noise sensitivity, nauseous, she was not able to identify triggers, she only has it occasionally in the past, but over the past few months, she been having it couple times each months  May 30, 2019, she presented to the emergency room for evaluation of sudden onset left facial, and lower extremity numbness, she felt heaviness of her left upper arm, leg, but denies difficulty using it, symptoms last about 1 hours, improved aft IV cocktail of Reglan, Toradol,  Personally reviewed CT head without contrast no acute intra-cranial abnormality, multifocal paranasal sinus disease  Laboratory evaluations showed normal CBC, CMP   REVIEW OF SYSTEMS: Full 14 system review of systems performed and notable only for as above All other review of systems were negative.  ALLERGIES: Allergies  Allergen Reactions  .  Amoxicillin Rash    Has patient had a PCN reaction causing immediate rash, facial/tongue/throat swelling, SOB or lightheadedness with hypotension: Yes Has patient had a PCN reaction causing severe rash involving mucus membranes or skin necrosis:Yes Has patient had a PCN reaction that required hospitalization: No Has patient had a PCN reaction occurring within the last 10 years: No If all of the above answers are "NO", then may proceed with Cephalosporin use.     HOME MEDICATIONS: Current Outpatient Medications  Medication Sig Dispense Refill  . naproxen sodium (ALEVE) 220 MG tablet Take 220 mg by mouth as needed (menstrual cramps).     . rizatriptan (MAXALT-MLT) 5 MG disintegrating tablet Take 1 tablet (5 mg total) by mouth as needed. May repeat in 2 hours if needed 6 tablet 11   No current facility-administered medications for this visit.     PAST MEDICAL HISTORY: Past Medical History:  Diagnosis Date  . Migraine     PAST SURGICAL HISTORY: History reviewed. No pertinent surgical history.  FAMILY HISTORY: Family History  Problem Relation Age of Onset  . Hypertension Mother   . Healthy Father   . Migraines Maternal Grandmother   . Hypertension Maternal Grandmother   . Heart attack Maternal Grandmother     SOCIAL HISTORY: Social History   Socioeconomic History  . Marital status: Single    Spouse name: Not on file  . Number of children: 0  . Years of education: college student  . Highest education level: Not on file  Occupational History  . Occupation: pick off line  Social Needs  . Financial resource strain: Not on file  .  Food insecurity    Worry: Not on file    Inability: Not on file  . Transportation needs    Medical: Not on file    Non-medical: Not on file  Tobacco Use  . Smoking status: Never Smoker  . Smokeless tobacco: Current User  Substance and Sexual Activity  . Alcohol use: No    Frequency: Never  . Drug use: Yes    Types: Marijuana    Comment:  occasional use  . Sexual activity: Not on file  Lifestyle  . Physical activity    Days per week: Not on file    Minutes per session: Not on file  . Stress: Not on file  Relationships  . Social Herbalist on phone: Not on file    Gets together: Not on file    Attends religious service: Not on file    Active member of club or organization: Not on file    Attends meetings of clubs or organizations: Not on file    Relationship status: Not on file  . Intimate partner violence    Fear of current or ex partner: Not on file    Emotionally abused: Not on file    Physically abused: Not on file    Forced sexual activity: Not on file  Other Topics Concern  . Not on file  Social History Narrative   Lives at home with roommates.   Right-handed.   Occasional caffeine use.     PHYSICAL EXAM   Vitals:   06/06/19 0858  BP: 116/72  Pulse: 62  Temp: 97.7 F (36.5 C)  Weight: 136 lb (61.7 kg)  Height: 5\' 4"  (1.626 m)    Not recorded      Body mass index is 23.34 kg/m.  PHYSICAL EXAMNIATION:  Gen: NAD, conversant, well nourised, obese, well groomed                     Cardiovascular: Regular rate rhythm, no peripheral edema, warm, nontender. Eyes: Conjunctivae clear without exudates or hemorrhage Neck: Supple, no carotid bruits. Pulmonary: Clear to auscultation bilaterally   NEUROLOGICAL EXAM:  MENTAL STATUS: Speech:    Speech is normal; fluent and spontaneous with normal comprehension.  Cognition:     Orientation to time, place and person     Normal recent and remote memory     Normal Attention span and concentration     Normal Language, naming, repeating,spontaneous speech     Fund of knowledge   CRANIAL NERVES: CN II: Visual fields are full to confrontation.  Pupils are round equal and briskly reactive to light. CN III, IV, VI: extraocular movement are normal. No ptosis. CN V: Facial sensation is intact to pinprick in all 3 divisions bilaterally. Corneal  responses are intact.  CN VII: Face is symmetric with normal eye closure and smile. CN VIII: Hearing is normal to rubbing fingers CN IX, X: Palate elevates symmetrically. Phonation is normal. CN XI: Head turning and shoulder shrug are intact CN XII: Tongue is midline with normal movements and no atrophy.  MOTOR: There is no pronator drift of out-stretched arms. Muscle bulk and tone are normal. Muscle strength is normal.  REFLEXES: Reflexes are 2+ and symmetric at the biceps, triceps, knees, and ankles. Plantar responses are flexor.  SENSORY: Intact to light touch, pinprick, positional sensation and vibratory sensation are intact in fingers and toes.  COORDINATION: Rapid alternating movements and fine finger movements are intact. There is no dysmetria on  finger-to-nose and heel-knee-shin.    GAIT/STANCE: Posture is normal. Gait is steady with normal steps, base, arm swing, and turning. Heel and toe walking are normal. Tandem gait is normal.  Romberg is absent.   DIAGNOSTIC DATA (LABS, IMAGING, TESTING) - I reviewed patient records, labs, notes, testing and imaging myself where available.   ASSESSMENT AND PLAN  Akirah Willette ClusterDashawn Noland is a 23 y.o. female   Complex migraine headaches  With left side paresthesia, language difficulty occasionally  She desires further evaluation, proceed with MRI of brain  Maxalt 5 mg as needed  Levert FeinsteinYijun Marabella Popiel, M.D. Ph.D.  [facility] [address], [address] [place], [place] [address] Ph: [phone] Fax: [phone]  CC: Referring Provider

## 2019-06-07 ENCOUNTER — Telehealth: Payer: Self-pay | Admitting: Neurology

## 2019-06-07 NOTE — Telephone Encounter (Signed)
[facility] pt has [facility] faxed order they will reach out to the patient to schedule MRI.

## 2019-06-29 ENCOUNTER — Telehealth: Payer: Self-pay | Admitting: Neurology

## 2019-06-29 NOTE — Telephone Encounter (Signed)
I spoke to the patient and notified her of the MRI results below.  States she has chronic issues with her sinuses.

## 2019-06-29 NOTE — Telephone Encounter (Signed)
Please call patient, MRI of the brain on [facility] on June 21, 2019, showed no acute intracranial abnormality, there is fluid at the right maxillary sinus, suggestive of chronic versus acute sinusitis,  Please ask him if he has any signs of infection, nasal discharge, if he does, he should contact his primary care or ENT for further evaluation

## 2021-06-04 ENCOUNTER — Telehealth: Payer: Self-pay | Admitting: Neurology

## 2021-06-04 NOTE — Telephone Encounter (Signed)
I spoke to the patient. Last seen 06/06/2019. She moved to [place] two years ago. She is no longer following up here. Says she only uses rizatriptan twice per year. Her supply has expired. She is going to contact her current PCP to ask for refills.

## 2021-06-04 NOTE — Telephone Encounter (Signed)
Pt called stating her migraine medicine has expired. Pt is requesting a call back.
